# Patient Record
Sex: Female | Born: 1990 | Hispanic: No | Marital: Single | Smoking: Former smoker
Health system: Southern US, Community
[De-identification: ages and names within clinical notes are randomized; demographics above are authoritative.]

## PROBLEM LIST (undated history)

## (undated) DIAGNOSIS — D649 Anemia, unspecified: Secondary | ICD-10-CM

---
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
## 2014-08-19 ENCOUNTER — Emergency Department (HOSPITAL_BASED_OUTPATIENT_CLINIC_OR_DEPARTMENT_OTHER): Payer: No Typology Code available for payment source

## 2014-08-19 ENCOUNTER — Emergency Department (HOSPITAL_BASED_OUTPATIENT_CLINIC_OR_DEPARTMENT_OTHER)
Admission: EM | Admit: 2014-08-19 | Discharge: 2014-08-20 | Disposition: A | Discharge status: Home / Self Care | Payer: No Typology Code available for payment source | Attending: Emergency Medicine | Admitting: Emergency Medicine

## 2014-08-19 ENCOUNTER — Encounter (HOSPITAL_BASED_OUTPATIENT_CLINIC_OR_DEPARTMENT_OTHER): Payer: Self-pay

## 2014-08-19 DIAGNOSIS — S29012A Strain of muscle and tendon of back wall of thorax, initial encounter: Secondary | ICD-10-CM | POA: Diagnosis not present

## 2014-08-19 DIAGNOSIS — Y9389 Activity, other specified: Secondary | ICD-10-CM | POA: Insufficient documentation

## 2014-08-19 DIAGNOSIS — T148XXA Other injury of unspecified body region, initial encounter: Secondary | ICD-10-CM

## 2014-08-19 DIAGNOSIS — R52 Pain, unspecified: Secondary | ICD-10-CM

## 2014-08-19 DIAGNOSIS — Z87891 Personal history of nicotine dependence: Secondary | ICD-10-CM | POA: Diagnosis not present

## 2014-08-19 DIAGNOSIS — Z79899 Other long term (current) drug therapy: Secondary | ICD-10-CM | POA: Insufficient documentation

## 2014-08-19 DIAGNOSIS — Y92410 Unspecified street and highway as the place of occurrence of the external cause: Secondary | ICD-10-CM | POA: Diagnosis not present

## 2014-08-19 DIAGNOSIS — Y998 Other external cause status: Secondary | ICD-10-CM | POA: Insufficient documentation

## 2014-08-19 DIAGNOSIS — S3992XA Unspecified injury of lower back, initial encounter: Secondary | ICD-10-CM | POA: Diagnosis present

## 2014-08-19 DIAGNOSIS — D649 Anemia, unspecified: Secondary | ICD-10-CM | POA: Insufficient documentation

## 2014-08-19 DIAGNOSIS — Z3202 Encounter for pregnancy test, result negative: Secondary | ICD-10-CM | POA: Insufficient documentation

## 2014-08-19 HISTORY — DX: Anemia, unspecified: D64.9

## 2014-08-19 MED ORDER — METHOCARBAMOL 500 MG PO TABS
1000.0000 mg | ORAL_TABLET | Freq: Once | ORAL | Status: AC
  Administered 2014-08-19: 1000 mg via ORAL
  Filled 2014-08-19: qty 2

## 2014-08-19 MED ORDER — NAPROXEN 250 MG PO TABS
500.0000 mg | ORAL_TABLET | Freq: Once | ORAL | Status: AC
  Administered 2014-08-19: 500 mg via ORAL
  Filled 2014-08-19: qty 2

## 2014-08-19 MED ORDER — TRAMADOL HCL 50 MG PO TABS
50.0000 mg | ORAL_TABLET | Freq: Once | ORAL | Status: AC
  Administered 2014-08-19: 50 mg via ORAL
  Filled 2014-08-19: qty 1
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
## 2014-08-20 ENCOUNTER — Encounter (HOSPITAL_BASED_OUTPATIENT_CLINIC_OR_DEPARTMENT_OTHER): Payer: Self-pay | Admitting: Emergency Medicine

## 2014-08-20 LAB — PREGNANCY, URINE: PREG TEST UR: NEGATIVE

## 2014-08-20 MED ORDER — NAPROXEN 375 MG PO TABS: 375.0000 mg | ORAL_TABLET | Freq: Two times a day (BID) | ORAL | Status: AC

## 2014-08-20 MED ORDER — TRAMADOL HCL 50 MG PO TABS: 50.0000 mg | ORAL_TABLET | Freq: Four times a day (QID) | ORAL | Status: AC | PRN

## 2014-08-20 MED ORDER — METHOCARBAMOL 500 MG PO TABS: 500.0000 mg | ORAL_TABLET | Freq: Two times a day (BID) | ORAL | Status: AC
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
